# Patient Record
Sex: Male | Born: 1970 | Hispanic: Yes | Smoking: Current every day smoker
Health system: Southern US, Community
[De-identification: ages and names within clinical notes are randomized; demographics above are authoritative.]

## PROBLEM LIST (undated history)

## (undated) DIAGNOSIS — C259 Malignant neoplasm of pancreas, unspecified: Secondary | ICD-10-CM

## (undated) DIAGNOSIS — E538 Deficiency of other specified B group vitamins: Secondary | ICD-10-CM

## (undated) HISTORY — PX: OTHER SURGICAL HISTORY: SHX169

## (undated) HISTORY — PX: EYE SURGERY: SHX253

---

## 1997-01-05 DIAGNOSIS — C259 Malignant neoplasm of pancreas, unspecified: Secondary | ICD-10-CM

## 1997-01-05 HISTORY — DX: Malignant neoplasm of pancreas, unspecified: C25.9

## 2005-02-14 ENCOUNTER — Emergency Department: Payer: Self-pay | Admitting: Emergency Medicine

## 2006-09-29 ENCOUNTER — Emergency Department: Payer: Self-pay

## 2006-11-09 ENCOUNTER — Emergency Department: Payer: Self-pay | Admitting: Emergency Medicine

## 2007-01-31 ENCOUNTER — Emergency Department: Payer: Self-pay | Admitting: Emergency Medicine

## 2007-02-06 ENCOUNTER — Emergency Department: Payer: Self-pay | Admitting: Emergency Medicine

## 2015-04-02 ENCOUNTER — Emergency Department
Admission: EM | Admit: 2015-04-02 | Discharge: 2015-04-02 | Disposition: A | Discharge status: Home / Self Care | Payer: Managed Care, Other (non HMO) | Attending: Emergency Medicine | Admitting: Emergency Medicine

## 2015-04-02 ENCOUNTER — Encounter: Payer: Self-pay | Admitting: Emergency Medicine

## 2015-04-02 DIAGNOSIS — K029 Dental caries, unspecified: Secondary | ICD-10-CM | POA: Diagnosis not present

## 2015-04-02 DIAGNOSIS — F17200 Nicotine dependence, unspecified, uncomplicated: Secondary | ICD-10-CM | POA: Insufficient documentation

## 2015-04-02 DIAGNOSIS — K0889 Other specified disorders of teeth and supporting structures: Secondary | ICD-10-CM | POA: Diagnosis present

## 2015-04-02 DIAGNOSIS — K047 Periapical abscess without sinus: Secondary | ICD-10-CM | POA: Insufficient documentation

## 2015-04-02 MED ORDER — OXYCODONE-ACETAMINOPHEN 5-325 MG PO TABS: 1.0000 | ORAL_TABLET | ORAL | Status: AC | PRN

## 2015-04-02 MED ORDER — AMOXICILLIN 500 MG PO TABS: 500.0000 mg | ORAL_TABLET | Freq: Two times a day (BID) | ORAL | Status: AC

## 2015-04-02 NOTE — ED Notes (Signed)
Pt with swelling to right lower jaw; reports pain started last night; possible tooth abscess.

## 2015-04-02 NOTE — ED Provider Notes (Signed)
[facility] Emergency Department Provider Note  ____________________________________________  Time seen: Approximately 12:11 PM  I have reviewed the triage vital signs and the nursing notes.   HISTORY  Chief Complaint Dental Pain    HPI Howard Ibarra is a 45 y.o. male presents for evaluation of sudden onset of swelling to his right lower jaw this morning. Patient states started a little bit last night and this morning noticed it was completely swollen. Denies any difficulty breathing or swallowing. Concerned about a dental abscess. Denies any specific tooth pain. Scribe's pain is 5 out of 10. Nonradiating. Has not tried any over-the-counter medications at this time.   History reviewed. No pertinent past medical history.  There are no active problems to display for this patient.   History reviewed. No pertinent past surgical history.  Current Outpatient Rx  Name  Route  Sig  Dispense  Refill  . amoxicillin (AMOXIL) 500 MG tablet   Oral   Take 1 tablet (500 mg total) by mouth 2 (two) times daily.   20 tablet   0   . oxyCODONE-acetaminophen (ROXICET) 5-325 MG tablet   Oral   Take 1-2 tablets by mouth every 4 (four) hours as needed for severe pain.   15 tablet   0     Allergies Review of patient's allergies indicates no known allergies.  No family history on file.  Social History Social History  Substance Use Topics  . Smoking status: Current Every Day Smoker  . Smokeless tobacco: None  . Alcohol Use: Yes    Review of Systems Constitutional: No fever/chills ENT: Positive for swollen right jaw Cardiovascular: Denies chest pain. Respiratory: Denies shortness of breath. Skin: Negative for rash. Neurological: Negative for headaches, focal weakness or numbness.  10-point ROS otherwise negative.  ____________________________________________   PHYSICAL EXAM:  VITAL SIGNS: ED Triage Vitals  Enc Vitals Group     BP 04/02/15 1147  135/84 mmHg     Pulse Rate 04/02/15 1147 90     Resp 04/02/15 1147 16     Temp 04/02/15 1147 98.6 F (37 C)     Temp Source 04/02/15 1147 Oral     SpO2 04/02/15 1147 97 %     Weight 04/02/15 1147 152 lb (68.947 kg)     Height 04/02/15 1147 5\' 7"  (1.702 m)     Head Cir --      Peak Flow --      Pain Score 04/02/15 1148 5     Pain Loc --      Pain Edu? --      Excl. in [place]? --     Constitutional: Alert and oriented. Well appearing and in no acute distress. Head: Atraumatic. Obvious dental caries bilaterally. Nose: No congestion/rhinnorhea. Mouth/Throat: Mucous membranes are moist.  Oropharynx non-erythematous. Right lower jaw with obvious edema of firmness and tenderness. Neck: No stridor.  Range of motion nontender Skin:  Skin is warm, dry and intact. No rash noted. Psychiatric: Mood and affect are normal. Speech and behavior are normal.  ____________________________________________   LABS (all labs ordered are listed, but only abnormal results are displayed)  Labs Reviewed - No data to display    PROCEDURES  Procedure(s) performed: None  Critical Care performed: No  ____________________________________________   INITIAL IMPRESSION / ASSESSMENT AND PLAN / ED COURSE  Pertinent labs & imaging results that were available during my care of the patient were reviewed by me and considered in my medical decision making (see chart for  details).  Acute dental abscess. Rx given for amoxicillin 500 mg twice a day, Percocet 5/325. Encouraged warm compresses and list of local dentists providers provided. Work excuse times today given. Patient follow-up with PCP or return to the ER as needed. ____________________________________________   FINAL CLINICAL IMPRESSION(S) / ED DIAGNOSES  Final diagnoses:  Abscess, dental     This chart was dictated using voice recognition software/Dragon. Despite best efforts to proofread, errors can occur which can change the meaning. Any change  was purely unintentional.   Arlyss Repress, PA-C 04/02/15 1235  Eula Listen, MD 04/02/15 1553

## 2015-04-02 NOTE — Discharge Instructions (Signed)
OPTIONS FOR DENTAL FOLLOW UP CARE ° °[place] Department of Health and Human Services - Local Safety Net Dental Clinics °http://www.ncdhhs.gov/dph/oralhealth/services/safetynetclinics.htm °  °[facility] (336-562-3123) ° °[facility] [place] (919-933-9087) ° °[facility] ([phone] ext [phone]) ° °[facility] ([phone]) ° °[facility] ([phone]) °This clinic caters to the indigent population and is on a lottery system. °Location: °[facility], [address], [place] °Clinic Hours: °Wednesdays from 6pm - 9pm, patients seen by a lottery system. °For dates, call or go to www.med.[facility].edu/[facility]ac/patients/Dental-[facility] °Services: °Cleanings, fillings and simple extractions. °Payment Options: °DENTAL WORK IS FREE OF CHARGE. Bring proof of income or support. °Best way to get seen: °Arrive at 5:15 pm - this is a lottery, NOT first come/first serve, so arriving earlier will not increase your chances of being seen. °  °  °[facility] °[phone] °Select option 1 for emergencies °  °Location: °[facility], [address], [place] °Clinic Hours: °No walk-ins accepted - call the day before to schedule an appointment. °Check in times are 9:30 am and 1:30 pm. °Services: °Simple extractions, temporary fillings, pulpectomy/pulp debridement, uncomplicated abscess drainage. °Payment Options: °PAYMENT IS DUE AT THE TIME OF SERVICE.  Fee is usually $100-200, additional surgical procedures (e.g. abscess drainage) may be extra. °Cash, checks, Visa/MasterCard accepted.  Can file Medicaid if patient is covered for dental - patient should call case worker to check. °No discount for [facility] patients. °Best way to get seen: °MUST call the day before and get onto the schedule. Can usually be seen the next 1-2 days. No walk-ins accepted. °  °  °[facility] °[phone] °   °Location: °[facility], [address], [place] °Clinic Hours: °M, W, Th, F 8am or 1:30pm, Tues 9a or 1:30 - first come/first served. °Services: °Simple extractions, temporary fillings, uncomplicated abscess drainage.  You do not need to be an [place] resident. °Payment Options: °PAYMENT IS DUE AT THE TIME OF SERVICE. °Dental insurance, otherwise sliding scale - bring proof of income or support. °Depending on income and treatment needed, cost is usually $50-200. °Best way to get seen: °Arrive early as it is first come/first served. °  °  °[facility] °[phone] °  °Location: °[address] °Clinic Hours: °Mon-Thu 8a-5p °Services: °Most basic dental services including extractions and fillings. °Payment Options: °PAYMENT IS DUE AT THE TIME OF SERVICE. °Sliding scale, up to 50% off - bring proof if income or support. °Medicaid with dental option accepted. °Best way to get seen: °Call to schedule an appointment, can usually be seen within 2 weeks OR they will try to see walk-ins - show up at 8a or 2p (you may have to wait). °  °  °[facility] °[phone] °[place] RESIDENTS ONLY °  °Location: °[facility], [address], [place], [place] [address] °Clinic Hours: By appointment only. °Monday - Thursday 8am-5pm, Friday 8am-12pm °Services: Cleanings, fillings, extractions. °Payment Options: °PAYMENT IS DUE AT THE TIME OF SERVICE. °Cash, Visa or MasterCard. Sliding scale - $30 minimum per service. °Best way to get seen: °Come in to office, complete packet and make an appointment - need proof of income °or support monies for each household member and proof of [place] residence. °Usually takes about a month to get in. °  °  °[facility] °[phone] °  °Location: °[address]   [place] [facility] Hours: Walk-in Urgent Care Dental Services are offered Monday-Friday  mornings only. The numbers of emergencies accepted daily is limited to the number of providers available. Maximum 15 - Mondays, Wednesdays & Thursdays Maximum 10 - Tuesdays & Fridays Services: You do not need to be a [facility] resident to be seen for a dental emergency. Emergencies are defined as pain, swelling, abnormal bleeding, or dental trauma. Walkins will receive x-rays if needed. NOTE: Dental cleaning is not an emergency. Payment Options: PAYMENT IS DUE AT THE TIME OF SERVICE. Minimum co-pay is $40.00 for uninsured patients. Minimum co-pay is $3.00 for Medicaid with dental coverage. Dental Insurance is accepted and must be presented at time of visit. Medicare does not cover dental. Forms of payment: Cash, credit card, checks. Best way to get seen: If not previously registered with the clinic, walk-in dental registration begins at 7:15 am and is on a first come/first serve basis. If previously registered with the clinic, call to make an appointment.     [facility] [place] ONLY   Location: [address], [place], [place] Clinic Hours: Mon-Thu 10a-2p Services: Extractions only! Payment Options: FREE (donations accepted) - bring proof of income or support Best way to get seen: Call and schedule an appointment OR come at 8am on the 1st Monday of every month (except for holidays) when it is first come/first served.     [facility] [phone]   Location: [place], [place] Clinic Hours: Friday mornings Services, Payment Options, Best way to get seen: Call for info Absceso dental (Dental Abscess) Un absceso dental es la acumulacin de pus en una pieza dental o alrededor de esta. CAUSAS La causa de la afeccin es una infeccin bacteriana alrededor de la raz de la pieza dental que compromete la parte interior del diente (pulpa dental). Puede presentarse como consecuencia de:  Caries importantes.  Traumatismo en  el diente que permite el ingreso de bacterias a la pulpa, como una pieza dental rota o astillada.  Enfermedad grave de las encas alrededor de una pieza dental. SNTOMAS Los sntomas de esta afeccin incluyen lo siguiente:  Dolor intenso en la pieza dental infectada o alrededor de esta.  Hinchazn y enrojecimiento alrededor de la pieza dental infectada, en la boca o en el rostro.  Dolor a Secretary/administrator.  Secrecin de pus.  Mal aliento.  Sabor amargo en la boca.  Dificultad para tragar.  Dificultad para abrir Equities trader.  Nuseas.  Vmitos.  Escalofros.  Ganglios del cuello inflamados.  Cristy Hilts. DIAGNSTICO Esta afeccin se diagnostica al examinar la pieza dental infectada. Durante el examen, el dentista puede darle golpecitos suaves en la pieza dental infectada. Adems, le har preguntas sobre sus antecedentes dentales y mdicos, y tal vez le indique que se haga radiografas. TRATAMIENTO El tratamiento de la afeccin consiste en eliminar la infeccin. Esto se puede hacer de las siguientes maneras:  Antibiticos.  Un tratamiento de conductos, que puede realizarse para salvar la pieza dental.  La extraccin de la pieza dental que tambin puede incluir el drenaje del absceso. La extraccin se hace si no es posible salvar la pieza dental. [place] los medicamentos solamente como se lo haya indicado el dentista.  Si le recetaron antibiticos, asegrese de terminarlos, incluso si comienza a Sports administrator.  Enjuguese la boca (haga grgaras) frecuentemente con agua con sal para aliviar el dolor o la hinchazn.  No conduzca ni opere  maquinaria pesada mientras toma analgsicos.  No se aplique calor en la parte externa de la boca.  Concurra a todas las visitas de control como se lo haya indicado el dentista. Esto es importante. SOLICITE ATENCIN MDICA SI:  El dolor es ms intenso y no se alivia con los medicamentos. SOLICITE ATENCIN  MDICA DE INMEDIATO SI:  Tiene fiebre o siente escalofros.  Los sntomas empeoran repentinamente.  Siente un dolor de cabeza muy intenso.  Tiene problemas para respirar o tragar.  Tiene dificultad para abrir Equities trader.  Nota hinchazn en el cuello o alrededor del ojo.   Esta informacin no tiene Marine scientist el consejo del mdico. Asegrese de hacerle al mdico cualquier pregunta que tenga.   Document Released: 12/22/2004 Document Revised: 05/08/2014 Elsevier Interactive Patient Education [employer].

## 2017-06-14 ENCOUNTER — Other Ambulatory Visit: Payer: Self-pay | Admitting: Family Medicine

## 2017-06-14 DIAGNOSIS — R221 Localized swelling, mass and lump, neck: Secondary | ICD-10-CM

## 2017-06-21 ENCOUNTER — Ambulatory Visit: Payer: 59

## 2017-06-24 ENCOUNTER — Other Ambulatory Visit: Payer: Self-pay | Admitting: Family Medicine

## 2017-06-24 DIAGNOSIS — R100 Acute abdomen: Secondary | ICD-10-CM

## 2017-07-01 ENCOUNTER — Other Ambulatory Visit: Payer: Self-pay | Admitting: Family Medicine

## 2017-07-01 DIAGNOSIS — Z8507 Personal history of malignant neoplasm of pancreas: Secondary | ICD-10-CM

## 2017-07-02 ENCOUNTER — Ambulatory Visit: Admission: RE | Admit: 2017-07-02 | Payer: 59 | Source: Ambulatory Visit

## 2017-07-16 ENCOUNTER — Other Ambulatory Visit: Payer: 59

## 2017-07-27 ENCOUNTER — Ambulatory Visit
Admission: RE | Admit: 2017-07-27 | Discharge: 2017-07-27 | Disposition: A | Discharge status: Home / Self Care | Payer: 59 | Source: Ambulatory Visit | Attending: Family Medicine | Admitting: Family Medicine

## 2017-07-27 DIAGNOSIS — Z8507 Personal history of malignant neoplasm of pancreas: Secondary | ICD-10-CM | POA: Diagnosis present

## 2017-07-27 DIAGNOSIS — T148XXS Other injury of unspecified body region, sequela: Secondary | ICD-10-CM | POA: Diagnosis not present

## 2017-07-27 DIAGNOSIS — R590 Localized enlarged lymph nodes: Secondary | ICD-10-CM | POA: Insufficient documentation

## 2017-07-27 DIAGNOSIS — K760 Fatty (change of) liver, not elsewhere classified: Secondary | ICD-10-CM | POA: Diagnosis not present

## 2017-07-27 DIAGNOSIS — Z9049 Acquired absence of other specified parts of digestive tract: Secondary | ICD-10-CM | POA: Insufficient documentation

## 2017-07-27 DIAGNOSIS — R100 Acute abdomen: Secondary | ICD-10-CM

## 2017-07-27 HISTORY — DX: Malignant neoplasm of pancreas, unspecified: C25.9

## 2017-07-27 MED ORDER — IOPAMIDOL (ISOVUE-300) INJECTION 61%
100.0000 mL | Freq: Once | INTRAVENOUS | Status: AC | PRN
  Administered 2017-07-27: 100 mL via INTRAVENOUS

## 2017-08-04 ENCOUNTER — Ambulatory Visit: Admission: RE | Admit: 2017-08-04 | Payer: 59 | Source: Ambulatory Visit | Admitting: Internal Medicine

## 2017-08-04 ENCOUNTER — Encounter: Payer: Self-pay | Admitting: Anesthesiology

## 2017-08-04 ENCOUNTER — Encounter: Admission: RE | Payer: Self-pay | Source: Ambulatory Visit

## 2017-08-04 HISTORY — DX: Deficiency of other specified B group vitamins: E53.8

## 2017-08-04 SURGERY — COLONOSCOPY WITH PROPOFOL
Anesthesia: General

## 2017-09-08 ENCOUNTER — Encounter: Admission: RE | Payer: Self-pay | Source: Ambulatory Visit

## 2017-09-08 ENCOUNTER — Ambulatory Visit: Admission: RE | Admit: 2017-09-08 | Payer: 59 | Source: Ambulatory Visit | Admitting: Internal Medicine

## 2017-09-08 SURGERY — EGD (ESOPHAGOGASTRODUODENOSCOPY)
Anesthesia: General

## 2017-09-14 ENCOUNTER — Ambulatory Visit
Admission: RE | Admit: 2017-09-14 | Discharge: 2017-09-14 | Disposition: A | Discharge status: Home / Self Care | Payer: 59 | Source: Ambulatory Visit | Attending: Internal Medicine | Admitting: Internal Medicine

## 2017-09-14 ENCOUNTER — Encounter
Admission: RE | Disposition: A | Discharge status: Home / Self Care | Payer: Self-pay | Source: Ambulatory Visit | Attending: Internal Medicine

## 2017-09-14 ENCOUNTER — Ambulatory Visit: Payer: 59 | Admitting: Anesthesiology

## 2017-09-14 ENCOUNTER — Encounter: Payer: Self-pay | Admitting: *Deleted

## 2017-09-14 DIAGNOSIS — K641 Second degree hemorrhoids: Secondary | ICD-10-CM | POA: Diagnosis not present

## 2017-09-14 DIAGNOSIS — Z8507 Personal history of malignant neoplasm of pancreas: Secondary | ICD-10-CM | POA: Insufficient documentation

## 2017-09-14 DIAGNOSIS — K625 Hemorrhage of anus and rectum: Secondary | ICD-10-CM | POA: Diagnosis present

## 2017-09-14 DIAGNOSIS — D500 Iron deficiency anemia secondary to blood loss (chronic): Secondary | ICD-10-CM | POA: Insufficient documentation

## 2017-09-14 DIAGNOSIS — F17200 Nicotine dependence, unspecified, uncomplicated: Secondary | ICD-10-CM | POA: Insufficient documentation

## 2017-09-14 DIAGNOSIS — Z9049 Acquired absence of other specified parts of digestive tract: Secondary | ICD-10-CM | POA: Insufficient documentation

## 2017-09-14 DIAGNOSIS — K921 Melena: Secondary | ICD-10-CM | POA: Insufficient documentation

## 2017-09-14 DIAGNOSIS — Z980 Intestinal bypass and anastomosis status: Secondary | ICD-10-CM | POA: Insufficient documentation

## 2017-09-14 DIAGNOSIS — K2950 Unspecified chronic gastritis without bleeding: Secondary | ICD-10-CM | POA: Insufficient documentation

## 2017-09-14 HISTORY — PX: ESOPHAGOGASTRODUODENOSCOPY (EGD) WITH PROPOFOL: SHX5813

## 2017-09-14 HISTORY — PX: COLONOSCOPY WITH PROPOFOL: SHX5780

## 2017-09-14 LAB — GLUCOSE, CAPILLARY: GLUCOSE-CAPILLARY: 85 mg/dL (ref 70–99)

## 2017-09-14 SURGERY — ESOPHAGOGASTRODUODENOSCOPY (EGD) WITH PROPOFOL
Anesthesia: General

## 2017-09-14 MED ORDER — PHENYLEPHRINE HCL 10 MG/ML IJ SOLN
INTRAMUSCULAR | Status: DC | PRN
  Administered 2017-09-14 (×2): 100 ug via INTRAVENOUS

## 2017-09-14 MED ORDER — PROPOFOL 10 MG/ML IV BOLUS
INTRAVENOUS | Status: DC | PRN
  Administered 2017-09-14: 100 mg via INTRAVENOUS

## 2017-09-14 MED ORDER — SODIUM CHLORIDE 0.9 % IV SOLN
INTRAVENOUS | Status: DC
  Administered 2017-09-14: 1000 mL via INTRAVENOUS

## 2017-09-14 MED ORDER — PROPOFOL 500 MG/50ML IV EMUL
INTRAVENOUS | Status: DC | PRN
  Administered 2017-09-14: 150 ug/kg/min via INTRAVENOUS

## 2017-09-14 MED ORDER — PROPOFOL 500 MG/50ML IV EMUL
INTRAVENOUS | Status: AC
  Filled 2017-09-14: qty 50

## 2017-09-14 MED ORDER — FENTANYL CITRATE (PF) 100 MCG/2ML IJ SOLN
INTRAMUSCULAR | Status: AC
  Filled 2017-09-14: qty 2

## 2017-09-14 MED ORDER — FENTANYL CITRATE (PF) 100 MCG/2ML IJ SOLN
INTRAMUSCULAR | Status: DC | PRN
  Administered 2017-09-14 (×2): 50 ug via INTRAVENOUS

## 2017-09-14 MED ORDER — LIDOCAINE 2% (20 MG/ML) 5 ML SYRINGE
INTRAMUSCULAR | Status: DC | PRN
  Administered 2017-09-14: 30 mg via INTRAVENOUS

## 2017-09-14 NOTE — Transfer of Care (Signed)
Immediate Anesthesia Transfer of Care Note  Patient: Howard Ibarra  Procedure(s) Performed: ESOPHAGOGASTRODUODENOSCOPY (EGD) WITH PROPOFOL (N/A ) COLONOSCOPY WITH PROPOFOL (N/A )  Patient Location: PACU and Endoscopy Unit  Anesthesia Type:General  Level of Consciousness: drowsy  Airway & Oxygen Therapy: Patient Spontanous Breathing and Patient connected to nasal cannula oxygen  Post-op Assessment: Report given to RN and Post -op Vital signs reviewed and stable  Post vital signs: stable  Last Vitals:  Vitals Value Taken Time  BP 97/72 09/14/2017 11:20 AM  Temp 36.1 C 09/14/2017 11:20 AM  Pulse 55 09/14/2017 11:20 AM  Resp 15 09/14/2017 11:20 AM  SpO2 97 % 09/14/2017 11:20 AM    Last Pain:  Vitals:   09/14/17 1022  TempSrc: Tympanic  PainSc: 0-No pain         Complications: No apparent anesthesia complications

## 2017-09-14 NOTE — Interval H&P Note (Signed)
History and Physical Interval Note:  09/14/2017 10:41 AM  Howard Ibarra  has presented today for surgery, with the diagnosis of RECTAL BLEEDING,ANEMIA  The various methods of treatment have been discussed with the patient and family. After consideration of risks, benefits and other options for treatment, the patient has consented to  Procedure(s): ESOPHAGOGASTRODUODENOSCOPY (EGD) WITH PROPOFOL (N/A) COLONOSCOPY WITH PROPOFOL (N/A) as a surgical intervention .  The patient's history has been reviewed, patient examined, no change in status, stable for surgery.  I have reviewed the patient's chart and labs.  Questions were answered to the patient's satisfaction.     [place], [place]

## 2017-09-14 NOTE — Anesthesia Post-op Follow-up Note (Signed)
Anesthesia QCDR form completed.        

## 2017-09-14 NOTE — Op Note (Signed)
[facility] Patient Name: Howard Ibarra Procedure Date: 09/14/2017 10:48 AM MRN: [id number] Account #: [id number] Date of Birth: [birth year] Admit Type: Outpatient Age: 47 Room: [facility] ENDO ROOM 2 Gender: Male Note Status: Finalized Procedure:            Colonoscopy Indications:          Hematochezia, Iron deficiency anemia secondary to                        chronic blood loss Providers:            Benay Pike. Alice Reichert MD, MD Referring MD:         No Local Md, MD (Referring MD) Medicines:            Propofol per Anesthesia Complications:        No immediate complications. Procedure:            Pre-Anesthesia Assessment:                       - Prior to the procedure, a History and Physical was                        performed, and patient medications, allergies and                        sensitivities were reviewed. The patient's tolerance of                        previous anesthesia was reviewed.                       - The risks and benefits of the procedure and the                        sedation options and risks were discussed with the                        patient. All questions were answered and informed                        consent was obtained.                       - Patient identification and proposed procedure were                        verified prior to the procedure by the nurse. The                        procedure was verified in the procedure room.                       - ASA Grade Assessment: II - A patient with mild                        systemic disease.                       - After reviewing the risks and benefits, the patient  was deemed in satisfactory condition to undergo the                        procedure.                       After obtaining informed consent, the colonoscope was                        passed under direct vision. Throughout the procedure,                        the patient's  blood pressure, pulse, and oxygen                        saturations were monitored continuously. The                        Colonoscope was introduced through the anus and                        advanced to the the ileocolonic anastomosis. The                        colonoscopy was performed without difficulty. The                        patient tolerated the procedure well. The quality of                        the bowel preparation was good. Ileocolonic anastomosis                        were photographed. Findings:      The perianal and digital rectal examinations were normal. Pertinent       negatives include normal sphincter tone and no palpable rectal lesions.      There was evidence of a prior functional end-to-end ileo-colonic       anastomosis in the ascending colon. This was patent and was       characterized by healthy appearing mucosa and an intact appearance. The       anastomosis was traversed. No biopsies or other specimens were collected       for this exam.      Non-bleeding internal hemorrhoids were found during retroflexion. The       hemorrhoids were Grade II (internal hemorrhoids that prolapse but reduce       spontaneously).      The exam was otherwise without abnormality. Impression:           - Patent functional end-to-end ileo-colonic                        anastomosis, characterized by healthy appearing mucosa                        and an intact appearance. No specimens collected.                       - Non-bleeding internal hemorrhoids.                       - The examination  was otherwise normal. Recommendation:       - Patient has a contact number available for                        emergencies. The signs and symptoms of potential                        delayed complications were discussed with the patient.                        Return to normal activities tomorrow. Written discharge                        instructions were provided to the patient.                        - Resume previous diet.                       - Continue present medications.                       - Await pathology results from EGD, also performed                        today.                       - Return to physician assistant in 3 months.                       - The findings and recommendations were discussed with                        the patient and their family. Procedure Code(s):    --- Professional ---                       [phone], Colonoscopy, flexible; diagnostic, including                        collection of specimen(s) by brushing or washing, when                        performed (separate procedure) Diagnosis Code(s):    --- Professional ---                       D50.0, Iron deficiency anemia secondary to blood loss                        (chronic)                       K92.1, Melena (includes Hematochezia)                       K64.1, Second degree hemorrhoids                       Z98.0, Intestinal bypass and anastomosis status CPT copyright 2017 American Medical Association. All rights reserved. The codes documented in this report are preliminary and upon coder review may  be revised to meet current compliance requirements. Efrain Sella MD,  MD 09/14/2017 11:17:01 AM This report has been signed electronically. Number of Addenda: 0 Note Initiated On: 09/14/2017 10:48 AM Scope Withdrawal Time: 0 hours 3 minutes 41 seconds  Total Procedure Duration: 0 hours 6 minutes 44 seconds       [facility]

## 2017-09-14 NOTE — Anesthesia Preprocedure Evaluation (Signed)
Anesthesia Evaluation  Patient identified by MRN, date of birth, ID band Patient awake    Reviewed: Allergy & Precautions, NPO status , Patient's Chart, lab work & pertinent test results  Airway Mallampati: II       Dental   Pulmonary Current Smoker,    Pulmonary exam normal        Cardiovascular negative cardio ROS Normal cardiovascular exam     Neuro/Psych negative neurological ROS  negative psych ROS   GI/Hepatic   Endo/Other  negative endocrine ROS  Renal/GU negative Renal ROS     Musculoskeletal negative musculoskeletal ROS (+)   Abdominal Normal abdominal exam  (+)   Peds negative pediatric ROS (+)  Hematology negative hematology ROS (+)   Anesthesia Other Findings Past Medical History: No date: B12 deficiency 1999: Pancreatic cancer ([place])     Comment:  Surgical resection @ [facility]  Reproductive/Obstetrics                             Anesthesia Physical Anesthesia Plan  ASA: II  Anesthesia Plan: General   Post-op Pain Management:    Induction: Intravenous  PONV Risk Score and Plan:   Airway Management Planned: Nasal Cannula  Additional Equipment:   Intra-op Plan:   Post-operative Plan:   Informed Consent: I have reviewed the patients History and Physical, chart, labs and discussed the procedure including the risks, benefits and alternatives for the proposed anesthesia with the patient or authorized representative who has indicated his/her understanding and acceptance.   Dental advisory given  Plan Discussed with: CRNA and Surgeon  Anesthesia Plan Comments:         Anesthesia Quick Evaluation

## 2017-09-14 NOTE — H&P (Signed)
Outpatient short stay form Pre-procedure 09/14/2017 10:39 AM Teodoro K. Alice Reichert, M.D.  Primary Physician: N/A  Reason for visit:  Rectal bleeding ,anemia secondary to blood loss.  History of present illness:  Patient with iron deficiency anemia secondary to blood loss. BRBPR occurs. No previous colonoscopy or EGD. Has hx of right hemicolectomy. Has hx of pancreatic cancer.    Current Facility-Administered Medications:  .  0.9 %  sodium chloride infusion, , Intravenous, Continuous, [place], Benay Pike, MD, Last Rate: 20 mL/hr at 09/14/17 1031, 1,000 mL at 09/14/17 1031  Medications Prior to Admission  Medication Sig Dispense Refill Last Dose  . amoxicillin (AMOXIL) 500 MG tablet Take 1 tablet (500 mg total) by mouth 2 (two) times daily. (Patient not taking: Reported on 08/03/2017) 20 tablet 0 Completed Course at Unknown time  . cyanocobalamin (,VITAMIN B-12,) 1000 MCG/ML injection Inject 1,000 mcg into the muscle every 30 (thirty) days.     Marland Kitchen oxyCODONE-acetaminophen (ROXICET) 5-325 MG tablet Take 1-2 tablets by mouth every 4 (four) hours as needed for severe pain. (Patient not taking: Reported on 08/03/2017) 15 tablet 0 Completed Course at Unknown time     No Known Allergies   Past Medical History:  Diagnosis Date  . B12 deficiency   . Pancreatic cancer [facility]) 1999   Surgical resection @ [facility]    Review of systems:  Otherwise negative.    Physical Exam  Gen: Alert, oriented. Appears stated age.  HEENT: /AT. PERRLA. Lungs: CTA, no wheezes. CV: RR nl S1, S2. Abd: soft, benign, no masses. BS+ Ext: No edema. Pulses 2+    Planned procedures: Proceed with EGD and  colonoscopy. The patient understands the nature of the planned procedure, indications, risks, alternatives and potential complications including but not limited to bleeding, infection, perforation, damage to internal organs and possible oversedation/side effects from anesthesia. The patient agrees and gives  consent to proceed.  Please refer to procedure notes for findings, recommendations and patient disposition/instructions.     Teodoro K. Alice Reichert, M.D. Gastroenterology 09/14/2017  10:39 AM

## 2017-09-14 NOTE — Op Note (Signed)
[facility] Gastroenterology Patient Name: Howard Ibarra Procedure Date: 09/14/2017 10:49 AM MRN: [id number] Account #: [id number] Date of Birth: 05-26-1970 Admit Type: Outpatient Age: 47 Room: [facility] ENDO ROOM 2 Gender: Male Note Status: Finalized Procedure:            Upper GI endoscopy Indications:          Iron deficiency anemia secondary to chronic blood loss,                        Hematochezia Providers:            Benay Pike. Alice Reichert MD, MD Referring MD:         No Local Md, MD (Referring MD) Medicines:            Propofol per Anesthesia Complications:        No immediate complications. Procedure:            Pre-Anesthesia Assessment:                       - The risks and benefits of the procedure and the                        sedation options and risks were discussed with the                        patient. All questions were answered and informed                        consent was obtained.                       - Patient identification and proposed procedure were                        verified prior to the procedure by the nurse. The                        procedure was verified in the procedure room.                       - ASA Grade Assessment: II - A patient with mild                        systemic disease.                       - After reviewing the risks and benefits, the patient                        was deemed in satisfactory condition to undergo the                        procedure.                       After obtaining informed consent, the endoscope was                        passed under direct vision. Throughout the procedure,  the patient's blood pressure, pulse, and oxygen                        saturations were monitored continuously. The Endoscope                        was introduced through the mouth, and advanced to the                        third part of duodenum. The upper GI endoscopy was         accomplished without difficulty. The patient tolerated                        the procedure well. Findings:      The examined esophagus was normal.      Scattered moderate inflammation characterized by congestion (edema),       erosions and erythema was found in the gastric body. Biopsies were taken       with a cold forceps for Helicobacter pylori testing.      The examined duodenum was normal. Impression:           - Normal esophagus.                       - Chronic gastritis. Biopsied.                       - Normal examined duodenum. Recommendation:       - Await pathology results.                       - Proceed with colonoscopy Procedure Code(s):    --- Professional ---                       [phone], Esophagogastroduodenoscopy, flexible, transoral;                        with biopsy, single or multiple Diagnosis Code(s):    --- Professional ---                       K92.1, Melena (includes Hematochezia)                       D50.0, Iron deficiency anemia secondary to blood loss                        (chronic)                       K29.50, Unspecified chronic gastritis without bleeding CPT copyright 2017 [employer]. All rights reserved. The codes documented in this report are preliminary and upon coder review may  be revised to meet current compliance requirements. Efrain Sella MD, MD 09/14/2017 11:00:43 AM This report has been signed electronically. Number of Addenda: 0 Note Initiated On: 09/14/2017 10:49 AM      [facility]

## 2017-09-15 ENCOUNTER — Encounter: Payer: Self-pay | Admitting: Internal Medicine

## 2017-09-15 LAB — SURGICAL PATHOLOGY

## 2017-09-15 NOTE — Anesthesia Postprocedure Evaluation (Signed)
Anesthesia Post Note  Patient: Howard Ibarra  Procedure(s) Performed: ESOPHAGOGASTRODUODENOSCOPY (EGD) WITH PROPOFOL (N/A ) COLONOSCOPY WITH PROPOFOL (N/A )  Patient location during evaluation: PACU Anesthesia Type: General Level of consciousness: awake and alert and oriented Pain management: pain level controlled Vital Signs Assessment: post-procedure vital signs reviewed and stable Respiratory status: spontaneous breathing Cardiovascular status: blood pressure returned to baseline Anesthetic complications: no     Last Vitals:  Vitals:   09/14/17 1140 09/14/17 1150  BP: (!) 110/94 100/82  Pulse: (!) 51 60  Resp: 12 15  Temp:    SpO2: 100% 98%    Last Pain:  Vitals:   09/14/17 1022  TempSrc: Tympanic  PainSc: 0-No pain                 Khadir Roam
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
